# Patient Record
Sex: Male | Born: 1970 | Race: White | Hispanic: No | Marital: Married | Smoking: Never smoker
Health system: Southern US, Community
[De-identification: ages and names within clinical notes are randomized; demographics above are authoritative.]

## PROBLEM LIST (undated history)

## (undated) DIAGNOSIS — I10 Essential (primary) hypertension: Secondary | ICD-10-CM

---

## 2016-02-21 ENCOUNTER — Encounter: Payer: Self-pay | Admitting: Family Medicine

## 2016-02-21 ENCOUNTER — Ambulatory Visit (INDEPENDENT_AMBULATORY_CARE_PROVIDER_SITE_OTHER): Payer: 59 | Admitting: Family Medicine

## 2016-02-21 VITALS — BP 150/88 | HR 77 | Temp 98.4°F | Resp 16 | Ht 72.0 in | Wt 227.0 lb

## 2016-02-21 DIAGNOSIS — J321 Chronic frontal sinusitis: Secondary | ICD-10-CM | POA: Diagnosis not present

## 2016-02-21 MED ORDER — MONTELUKAST SODIUM 10 MG PO TABS: 10.0000 mg | ORAL_TABLET | Freq: Every day | ORAL | 0 refills | Status: DC

## 2016-02-21 MED ORDER — AZITHROMYCIN 250 MG PO TABS: ORAL_TABLET | ORAL | 0 refills | Status: AC

## 2016-02-21 NOTE — Progress Notes (Signed)
Patient: Andre Curry Male    DOB: 09/30/1970   46 y.o.   MRN: [id number] Visit Date: 02/21/2016  Today's Provider: Mila Merryonald Awesome Jared, MD   Chief Complaint  Patient presents with  . Cough   Subjective:    Patient has had cough and sinus drainage for 15 days. Patient states he cough all the time and when he lays done at night. Patient also has some ear congestion and sinus pressure. Cough is described as mostly dry. No fever, sob or wheezing. Patient takes otc zyrtec daily and also uses cough drop with mild relief.    Cough  This is a new problem. The current episode started 1 to 4 weeks ago (15 days). The problem has been gradually improving. The cough is productive of sputum. Associated symptoms include ear congestion, nasal congestion, postnasal drip and a sore throat. Pertinent negatives include no chest pain, chills, ear pain, fever, heartburn, hemoptysis, myalgias, rash, rhinorrhea, shortness of breath, sweats, weight loss or wheezing. The symptoms are aggravated by lying down. He has tried OTC cough suppressant for the symptoms. The treatment provided mild relief. His past medical history is significant for environmental allergies. There is no history of asthma, bronchiectasis, bronchitis, COPD, emphysema or pneumonia.   Taken Advil cold and cough, coracedin, which helped a bit with cough, but symptoms returned when he stopped medication.     Allergies not on file  No current outpatient prescriptions on file.  Review of Systems  Constitutional: Negative for appetite change, chills, fever and weight loss.  HENT: Positive for congestion, postnasal drip, sinus pain, sinus pressure and sore throat. Negative for ear pain and rhinorrhea.   Respiratory: Positive for cough. Negative for hemoptysis, chest tightness, shortness of breath and wheezing.   Cardiovascular: Negative for chest pain and palpitations.  Gastrointestinal: Negative for abdominal pain, heartburn, nausea and  vomiting.  Musculoskeletal: Negative for myalgias.  Skin: Negative for rash.  Allergic/Immunologic: Positive for environmental allergies.    Social History  Substance Use Topics  . Smoking status: Not on file  . Smokeless tobacco: Not on file  . Alcohol use Not on file   Objective:   BP (!) 150/88 (BP Location: Left Arm, Patient Position: Sitting, Cuff Size: Large)   Pulse 77   Temp 98.4 F (36.9 C) (Oral)   Resp 16   Ht 6' (1.829 m)   Wt 227 lb (103 kg)   SpO2 98%   BMI 30.79 kg/m   Physical Exam  General Appearance:    Alert, cooperative, no distress  HENT:   bilateral TM normal without fluid or infection, neck has bilateral anterior cervical nodes enlarged, throat normal without erythema or exudate, frontal sinus tender and nasal mucosa pale and congested  Eyes:    PERRL, conjunctiva/corneas clear, EOM's intact       Lungs:     Clear to auscultation bilaterally, respirations unlabored  Heart:    Regular rate and rhythm  Neurologic:   Awake, alert, oriented x 3. No apparent focal neurological           defect.           Assessment & Plan:     1. Frontal sinusitis, unspecified chronicity  - azithromycin (ZITHROMAX) 250 MG tablet; 2 by mouth today, then 1 daily for 4 days  Dispense: 6 tablet; Refill: 0 - montelukast (SINGULAIR) 10 MG tablet; Take 1 tablet (10 mg total) by mouth at bedtime.  Dispense: 30 tablet; Refill: 0  Call if symptoms change or if not rapidly improving.        The entirety of the information documented in the History of Present Illness, Review of Systems and Physical Exam were personally obtained by me. Portions of this information were initially documented by April M. Hyacinth Meeker, CMA and reviewed by me for thoroughness and accuracy.     Mila Merry, MD  [facility]

## 2016-03-19 ENCOUNTER — Other Ambulatory Visit: Payer: Self-pay | Admitting: Family Medicine

## 2016-03-19 DIAGNOSIS — J321 Chronic frontal sinusitis: Secondary | ICD-10-CM

## 2020-01-21 ENCOUNTER — Encounter: Payer: Self-pay | Admitting: Physician Assistant

## 2020-01-21 ENCOUNTER — Other Ambulatory Visit: Payer: Self-pay

## 2020-01-21 ENCOUNTER — Ambulatory Visit: Payer: 59 | Admitting: Physician Assistant

## 2020-01-21 VITALS — BP 199/111 | HR 68 | Temp 98.2°F | Ht 72.0 in | Wt 248.9 lb

## 2020-01-21 DIAGNOSIS — I10 Essential (primary) hypertension: Secondary | ICD-10-CM | POA: Diagnosis not present

## 2020-01-21 MED ORDER — AMLODIPINE BESYLATE 5 MG PO TABS: 5.0000 mg | ORAL_TABLET | Freq: Every day | ORAL | 0 refills | Status: DC

## 2020-01-21 NOTE — Patient Instructions (Signed)

## 2020-01-21 NOTE — Progress Notes (Signed)
New patient visit   Patient: Andre Curry   DOB: [birth year]   49 y.o. Male  MRN: [id number] Visit Date: 01/21/2020  Today's healthcare provider: Trey Sailors, PA-C   Chief Complaint  Patient presents with  . New Patient (Initial Visit)  I,Andre Curry Skylinn Vialpando,acting as a scribe for Trey Sailors, PA-C.,have documented all relevant documentation on the behalf of Trey Sailors, PA-C,as directed by  Trey Sailors, PA-C while in the presence of Trey Sailors, PA-C.  Subjective    [name] is a 49 y.o. male who presents today as a new patient to establish care.  HPI   Hypertension, follow-up  BP Readings from Last 3 Encounters:  01/21/20 (!) 199/111  02/21/16 (!) 150/88   Wt Readings from Last 3 Encounters:  01/21/20 248 lb 14.4 oz (112.9 kg)  02/21/16 227 lb (103 kg)     He was last seen for hypertension 5 years ago.  BP at that visit was elevated. Management since that visit includes none. He has never been formally diagnosed with HTN. Review of old records show hypertensive readings dating back to 56.   He is not exercising. He does not smoke.  Use of agents associated with hypertension: none.   Outside blood pressures are elevated. Symptoms: No chest pain No chest pressure  No palpitations No syncope  No dyspnea No orthopnea  No paroxysmal nocturnal dyspnea No lower extremity edema   Pertinent labs: No results found for: CHOL, HDL, LDLCALC, LDLDIRECT, TRIG, CHOLHDL No results found for: NA, K, CREATININE, GFRNONAA, GFRAA, GLUCOSE   The ASCVD Risk score Denman George DC Jr., et al., 2013) failed to calculate for the following reasons:   Cannot find a previous HDL lab   Cannot find a previous total cholesterol lab   ---------------------------------------------------------------------------------------------------  Wt Readings from Last 3 Encounters:  01/21/20 248 lb 14.4 oz (112.9 kg)  02/21/16 227 lb (103 kg)    BMI Readings from Last  5 Encounters:  01/21/20 33.76 kg/Curry  02/21/16 30.79 kg/Curry      History reviewed. No pertinent past medical history. History reviewed. No pertinent surgical history. No family status information on file.   History reviewed. No pertinent family history. Social History   Socioeconomic History  . Marital status: Married    Spouse name: Not on file  . Number of children: Not on file  . Years of education: Not on file  . Highest education level: Not on file  Occupational History  . Not on file  Tobacco Use  . Smoking status: Never Smoker  . Smokeless tobacco: Never Used  Vaping Use  . Vaping Use: Never used  Substance and Sexual Activity  . Alcohol use: Never  . Drug use: Never  . Sexual activity: Not on file  Other Topics Concern  . Not on file  Social History Narrative  . Not on file   Social Determinants of Health   Financial Resource Strain: Not on file  Food Insecurity: Not on file  Transportation Needs: Not on file  Physical Activity: Not on file  Stress: Not on file  Social Connections: Not on file   Outpatient Medications Prior to Visit  Medication Sig  . cetirizine (ZYRTEC) 10 MG tablet Take 10 mg by mouth daily.  . montelukast (SINGULAIR) 10 MG tablet Take 1 tablet (10 mg total) by mouth at bedtime.   No facility-administered medications prior to visit.   Allergies  Allergen Reactions  . Amoxicillin Hives  .  Tetracycline Hives    Immunization History  Administered Date(s) Administered  . Influenza-Unspecified 11/28/2017  . Tdap 11/17/2008    Health Maintenance  Topic Date Due  . Hepatitis C Screening  Never done  . COVID-19 Vaccine (1) Never done  . HIV Screening  Never done  . TETANUS/TDAP  11/18/2018  . INFLUENZA VACCINE  09/13/2019    Patient Care Team: Malva Limes, MD as PCP - General (Family Medicine)  Review of Systems    Objective    BP (!) 199/111 (BP Location: Left Arm, Patient Position: Sitting, Cuff Size: Large)    Pulse 68   Temp 98.2 F (36.8 C) (Oral)   Ht 6' (1.829 Curry)   Wt 248 lb 14.4 oz (112.9 kg)   SpO2 100%   BMI 33.76 kg/Curry  Physical Exam Constitutional:      Appearance: Normal appearance.  Cardiovascular:     Rate and Rhythm: Normal rate and regular rhythm.     Heart sounds: Normal heart sounds.  Pulmonary:     Effort: Pulmonary effort is normal.     Breath sounds: Normal breath sounds.  Skin:    General: Skin is warm and dry.  Neurological:     Mental Status: He is alert and oriented to person, place, and time. Mental status is at baseline.  Psychiatric:        Mood and Affect: Mood normal.        Behavior: Behavior normal.      Depression Screen PHQ 2/9 Scores 01/21/2020  PHQ - 2 Score 0  PHQ- 9 Score 0   No results found for any visits on 01/21/20.  Assessment & Plan     1. Primary hypertension  EKG Refused. Start medication as below. Advised to avoid medications like sudafed, cold/flu cocktails, afrin nasal spray. F/u 1 month. Labs as below.   - amLODipine (NORVASC) 5 MG tablet; Take 1 tablet (5 mg total) by mouth daily.  Dispense: 90 tablet; Refill: 0 - TSH - Lipid panel - Comprehensive metabolic panel - CBC with Differential/Platelet   No follow-ups on file.        Maryella Shivers  [facility] [phone] (phone) [phone] (fax)  [facility]

## 2020-01-22 ENCOUNTER — Encounter: Payer: Self-pay | Admitting: Physician Assistant

## 2020-02-23 ENCOUNTER — Ambulatory Visit: Payer: Self-pay | Admitting: Physician Assistant

## 2020-02-29 ENCOUNTER — Other Ambulatory Visit: Payer: Self-pay

## 2020-02-29 ENCOUNTER — Encounter: Payer: Self-pay | Admitting: Emergency Medicine

## 2020-02-29 ENCOUNTER — Emergency Department
Admission: EM | Admit: 2020-02-29 | Discharge: 2020-02-29 | Disposition: A | Discharge status: Home / Self Care | Payer: 59 | Attending: Emergency Medicine | Admitting: Emergency Medicine

## 2020-02-29 ENCOUNTER — Emergency Department: Payer: 59

## 2020-02-29 DIAGNOSIS — J40 Bronchitis, not specified as acute or chronic: Secondary | ICD-10-CM | POA: Insufficient documentation

## 2020-02-29 DIAGNOSIS — U071 COVID-19: Secondary | ICD-10-CM | POA: Insufficient documentation

## 2020-02-29 DIAGNOSIS — I10 Essential (primary) hypertension: Secondary | ICD-10-CM | POA: Insufficient documentation

## 2020-02-29 DIAGNOSIS — Z79899 Other long term (current) drug therapy: Secondary | ICD-10-CM | POA: Insufficient documentation

## 2020-02-29 DIAGNOSIS — J1282 Pneumonia due to coronavirus disease 2019: Secondary | ICD-10-CM | POA: Diagnosis not present

## 2020-02-29 DIAGNOSIS — R0602 Shortness of breath: Secondary | ICD-10-CM | POA: Diagnosis present

## 2020-02-29 HISTORY — DX: Essential (primary) hypertension: I10

## 2020-02-29 MED ORDER — PREDNISONE 20 MG PO TABS: 40.0000 mg | ORAL_TABLET | Freq: Every day | ORAL | 0 refills | Status: AC

## 2020-02-29 MED ORDER — BENZONATATE 100 MG PO CAPS: ORAL_CAPSULE | ORAL | 0 refills | Status: AC

## 2020-02-29 MED ORDER — ALBUTEROL SULFATE HFA 108 (90 BASE) MCG/ACT IN AERS: 2.0000 | INHALATION_SPRAY | Freq: Four times a day (QID) | RESPIRATORY_TRACT | 0 refills | Status: AC | PRN

## 2020-02-29 MED ORDER — PREDNISONE 20 MG PO TABS
60.0000 mg | ORAL_TABLET | Freq: Once | ORAL | Status: AC
  Administered 2020-02-29: 60 mg via ORAL
  Filled 2020-02-29: qty 3

## 2020-02-29 MED ORDER — BENZONATATE 100 MG PO CAPS
200.0000 mg | ORAL_CAPSULE | Freq: Once | ORAL | Status: AC
  Administered 2020-02-29: 200 mg via ORAL
  Filled 2020-02-29: qty 2

## 2020-02-29 NOTE — ED Triage Notes (Signed)
Pt reports covid (+) since last Tuesday and still feeling SOB, fevers and cough and weak

## 2020-02-29 NOTE — Discharge Instructions (Signed)
Take the prescription meds as directed. Follow-up with your primary provider as needed.  

## 2020-02-29 NOTE — ED Provider Notes (Signed)
[facility] Emergency Department Provider Note ____________________________________________  Time seen: 1655  I have reviewed the triage vital signs and the nursing notes.  HISTORY  Chief Complaint  Shortness of Breath, Cough, Generalized Body Aches, and Fever  HPI Andre Curry is a 50 y.o. male presents to the ED with feeling shortness of breath, fevers, persistent cough since his positive COVID test last week on Tuesday.  Patient reports has not previously been vaccinated against COVID or influenza.  He reports his contact was probably through his coworkers.  Since testing, the patient has had a persistent cough leading him to feel short of breath.  He apparently bought a pulse oximeter for home use, and his wife prompted him to come into the ED today after he had readings that measured in the upper 80s.  Patient describes a low reading of 74% on room air, after he had shoveled the walkway free of ice.  Patient denies any frank chest pain, syncope, or other symptoms at this time.  He is taken a prescription codeine cough syrup but denies any meaningful relief.   Past Medical History:  Diagnosis Date  . Hypertension     Patient Active Problem List   Diagnosis Date Noted  . Allergic rhinitis 11/21/2008  . Blood pressure elevated without history of HTN 11/17/2008    History reviewed. No pertinent surgical history.  Prior to Admission medications   Medication Sig Start Date End Date Taking? Authorizing Provider  albuterol (VENTOLIN HFA) 108 (90 Base) MCG/ACT inhaler Inhale 2 puffs into the lungs every 6 (six) hours as needed for shortness of breath. 02/29/20  Yes Munira Polson, Charlesetta Ivory, PA-C  benzonatate (TESSALON PERLES) 100 MG capsule Take 1-2 tabs TID prn cough 02/29/20  Yes Alecxander Mainwaring, Charlesetta Ivory, PA-C  predniSONE (DELTASONE) 20 MG tablet Take 2 tablets (40 mg total) by mouth daily with breakfast for 5 days. 02/29/20 03/05/20 Yes Rihan Schueler, Charlesetta Ivory,  PA-C  amLODipine (NORVASC) 5 MG tablet Take 1 tablet (5 mg total) by mouth daily. 01/21/20   Trey Sailors, PA-C  cetirizine (ZYRTEC) 10 MG tablet Take 10 mg by mouth daily.    [provider]  montelukast (SINGULAIR) 10 MG tablet Take 1 tablet (10 mg total) by mouth at bedtime. 03/19/16   Malva Limes, MD    Allergies Amoxicillin and Tetracycline  No family history on file.  Social History Social History   Tobacco Use  . Smoking status: Never Smoker  . Smokeless tobacco: Never Used  Vaping Use  . Vaping Use: Never used  Substance Use Topics  . Alcohol use: Never  . Drug use: Never    Review of Systems  Constitutional: Negative for fever. Eyes: Negative for visual changes. ENT: Negative for sore throat. Cardiovascular: Negative for chest pain. Respiratory: Positive for shortness of breath. Gastrointestinal: Negative for abdominal pain, vomiting and diarrhea. Genitourinary: Negative for dysuria. Musculoskeletal: Negative for back pain. Skin: Negative for rash. Neurological: Negative for headaches, focal weakness or numbness. ____________________________________________  PHYSICAL EXAM:  VITAL SIGNS: ED Triage Vitals  Enc Vitals Group     BP 02/29/20 1113 (!) 147/89     Pulse Rate 02/29/20 1113 98     Resp 02/29/20 1105 20     Temp 02/29/20 1113 98.2 F (36.8 C)     Temp Source 02/29/20 1113 Oral     SpO2 02/29/20 1105 94 %     Weight 02/29/20 1105 241 lb (109.3 kg)  Height 02/29/20 1105 6' (1.829 m)     Head Circumference --      Peak Flow --      Pain Score 02/29/20 1105 0     Pain Loc --      Pain Edu? --      Excl. in GC? --     Constitutional: Alert and oriented. Well appearing and in no distress. Head: Normocephalic and atraumatic. Eyes: Conjunctivae are normal. Normal extraocular movements Cardiovascular: Normal rate, regular rhythm. Normal distal pulses. Respiratory: Normal respiratory effort. No  wheezes/rales/rhonchi. Musculoskeletal: Nontender with normal range of motion in all extremities.  Neurologic:  Normal gait without ataxia. Normal speech and language. No gross focal neurologic deficits are appreciated. Skin:  Skin is warm, dry and intact. No rash noted. Psychiatric: Mood and affect are normal. Patient exhibits appropriate insight and judgment. ____________________________________________   RADIOLOGY  CXR  IMPRESSION: Bilateral pulmonary airspace disease, consistent with viral/atypical pneumonia. ____________________________________________  PROCEDURES  Tessalon Perle 200 mg PO Prednisone 60 mg PO Ambulatory SpO2 = >92% RA  Procedures ____________________________________________  INITIAL IMPRESSION / ASSESSMENT AND PLAN / ED COURSE  DDX: covid pneumonia, PE, bronchitis  Patient ED evaluation of symptoms related to his recent diagnosis of COVID.  Patient presents with complaints of ongoing shortness of breath and persistent cough.  His exam is overall benign return at this time.  Patient presents with stable and reassuring vital signs.  His saturations remain in the mid to upper 90s, including with ambulation on room air.  Patient is without tachycardia, tachypnea, or hypoxia.  Doubt PE given his stable presentation.  Chest x-ray does reveal some atypical changes consistent with a viral etiology.  Patient's exam is reassuring and he is amenable to the plan to discharge with some medications for symptom management.  He will be discharged with a prescription for Tessalon Perles, prednisone, albuterol inhaler.  He is advised to follow-up with primary provider for ongoing symptoms.  He will remain out of work for the remainder of this week, and will follow-up as directed.  Turn precautions have been discussed.   Andre Curry was evaluated in Emergency Department on 02/29/2020 for the symptoms described in the history of present illness. He was evaluated in the context  of the global COVID-19 pandemic, which necessitated consideration that the patient might be at risk for infection with the SARS-CoV-2 virus that causes COVID-19. Institutional protocols and algorithms that pertain to the evaluation of patients at risk for COVID-19 are in a state of rapid change based on information released by regulatory bodies including the CDC and federal and state organizations. These policies and algorithms were followed during the patient's care in the ED. ____________________________________________  FINAL CLINICAL IMPRESSION(S) / ED DIAGNOSES  Final diagnoses:  Pneumonia due to COVID-19 virus  Bronchitis      Shalev Helminiak, Charlesetta Ivory, PA-C 02/29/20 2355    Phineas Semen, MD 03/02/20 [phone]

## 2020-04-12 ENCOUNTER — Other Ambulatory Visit: Payer: Self-pay | Admitting: Physician Assistant

## 2020-04-12 DIAGNOSIS — I10 Essential (primary) hypertension: Secondary | ICD-10-CM

## 2022-05-28 IMAGING — CR DG CHEST 2V
1 series · 2 of 2 positions shown · non-contrast
Comparison: None.

CLINICAL DATA: Cough and shortness of breath. Fever. [id number] virus
infection.

EXAM:
CHEST - 2 VIEW

[Series 1: w chest pa · 0.14mm/px · 2 of 2 slices shown]
[im 1/2]
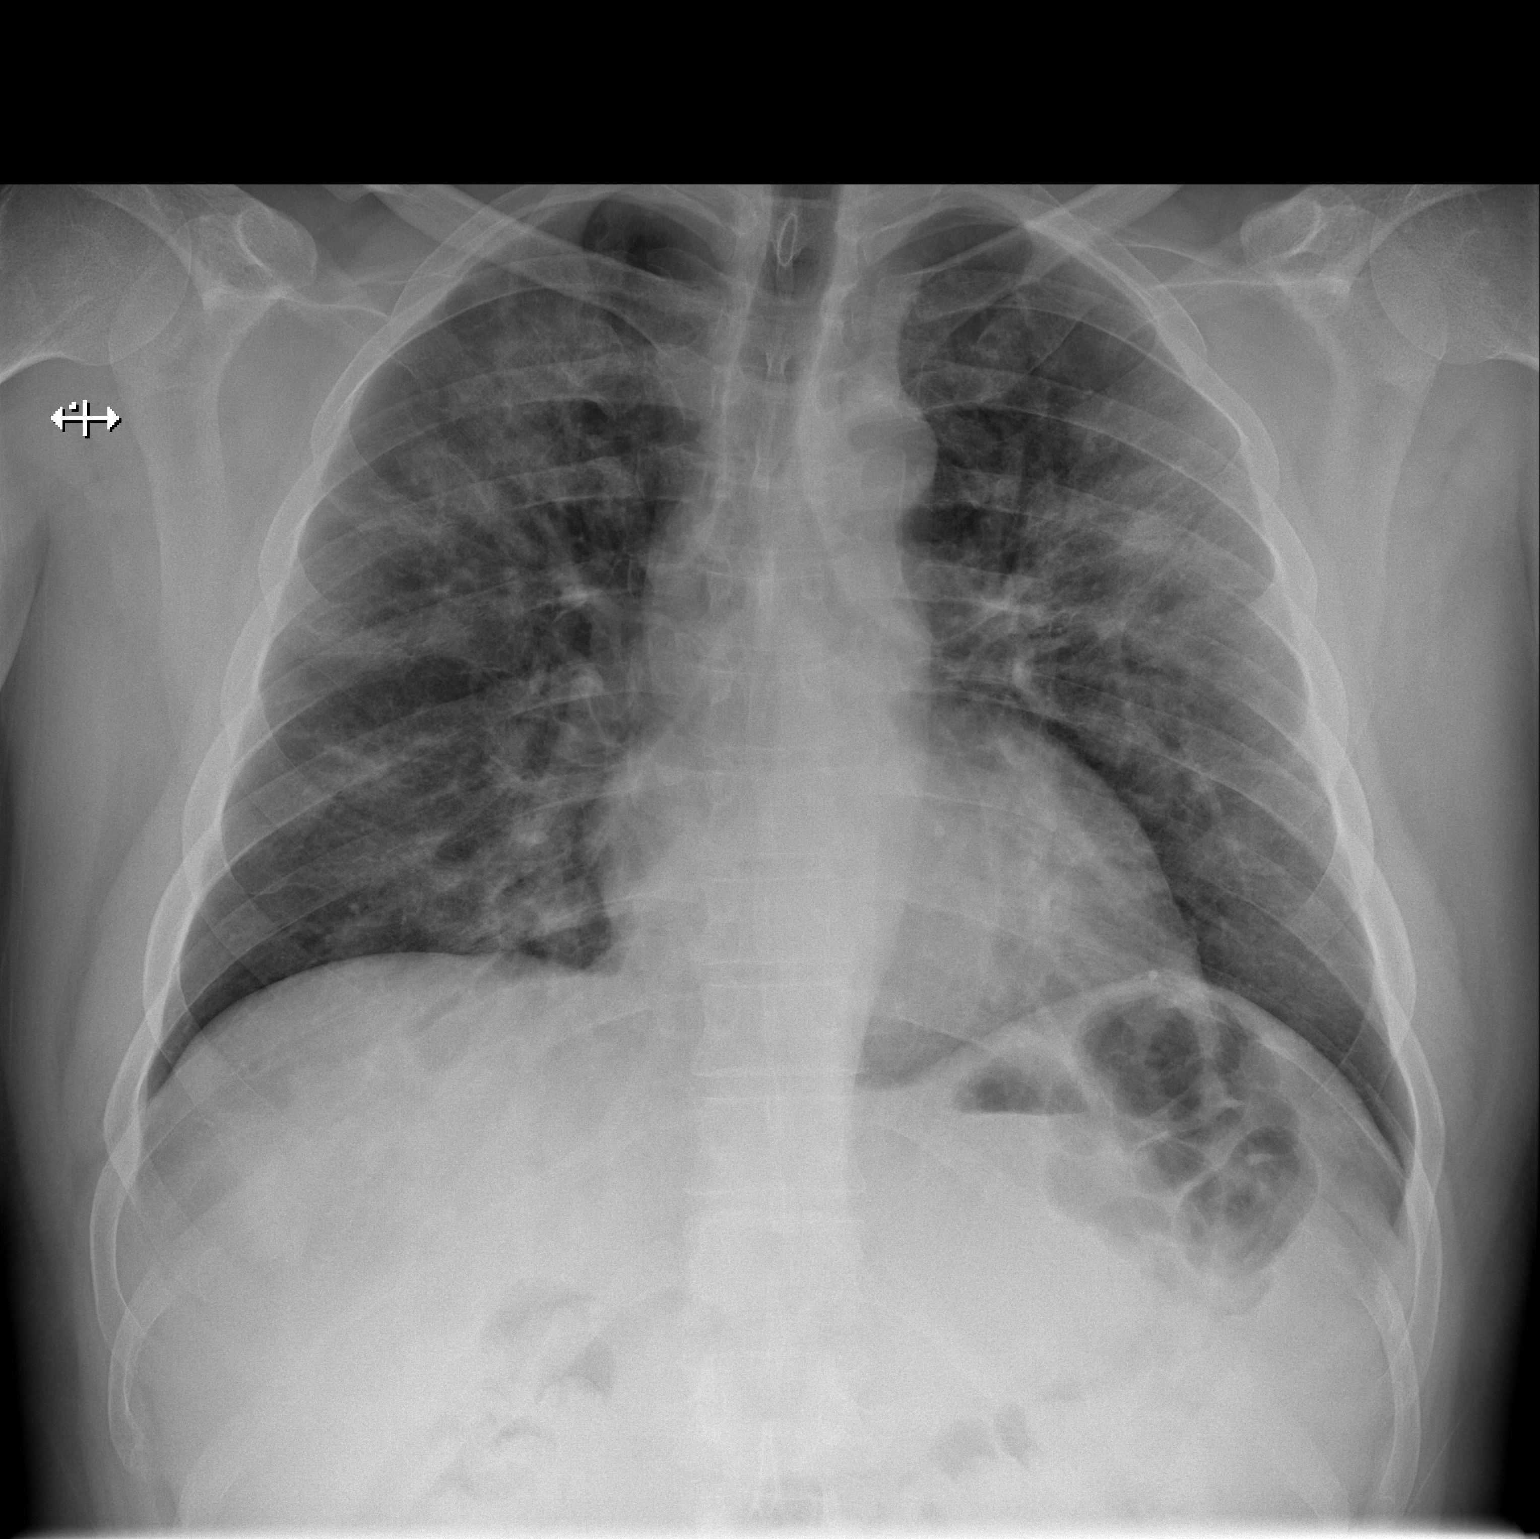
[im 2/2]
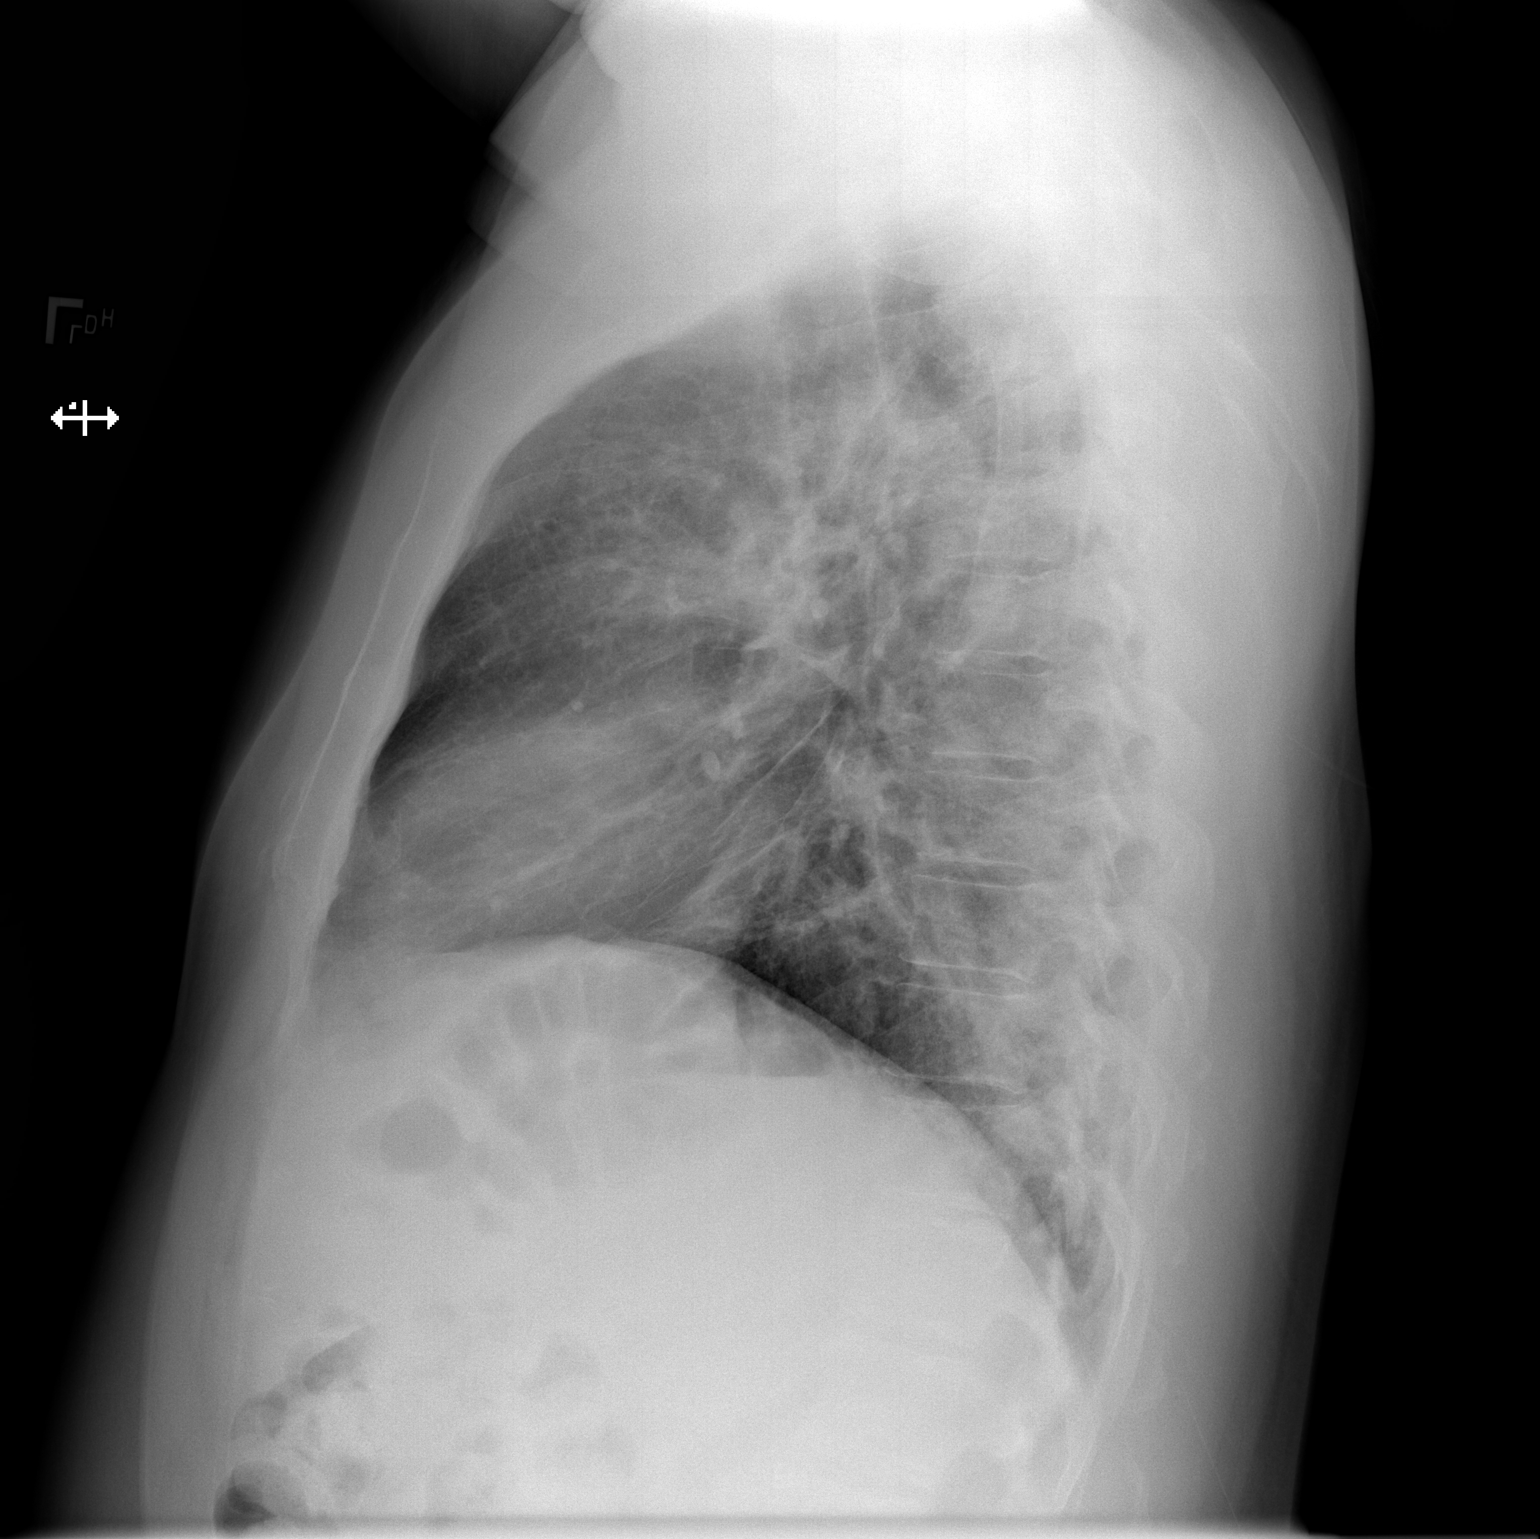

[2 of 2 positions shown; findings below may reference images not displayed]

FINDINGS: The heart size and mediastinal contours are within normal limits.
Bilateral pulmonary airspace disease is seen in both upper lobes and
right lower lobe. No evidence of pleural effusion.
IMPRESSION: Bilateral pulmonary airspace disease, consistent with viral/atypical
pneumonia.
